# Patient Record
Sex: Male | Born: 1995
Health system: Southern US, Community
[De-identification: ages and names within clinical notes are randomized; demographics above are authoritative.]

## PROBLEM LIST (undated history)

## (undated) DIAGNOSIS — F329 Major depressive disorder, single episode, unspecified: Secondary | ICD-10-CM

## (undated) DIAGNOSIS — F419 Anxiety disorder, unspecified: Secondary | ICD-10-CM

## (undated) DIAGNOSIS — J309 Allergic rhinitis, unspecified: Secondary | ICD-10-CM

## (undated) DIAGNOSIS — L709 Acne, unspecified: Secondary | ICD-10-CM

## (undated) DIAGNOSIS — F32A Depression, unspecified: Secondary | ICD-10-CM

## (undated) DIAGNOSIS — Z973 Presence of spectacles and contact lenses: Secondary | ICD-10-CM

## (undated) HISTORY — DX: Acne, unspecified: L70.9

## (undated) HISTORY — DX: Anxiety disorder, unspecified: F41.9

## (undated) HISTORY — DX: Depression, unspecified: F32.A

## (undated) HISTORY — PX: LACERATION REPAIR: SHX5168

## (undated) HISTORY — DX: Major depressive disorder, single episode, unspecified: F32.9

## (undated) HISTORY — DX: Presence of spectacles and contact lenses: Z97.3

## (undated) HISTORY — PX: NASAL SEPTUM SURGERY: SHX37

## (undated) HISTORY — DX: Allergic rhinitis, unspecified: J30.9

---

## 2016-09-26 ENCOUNTER — Ambulatory Visit (INDEPENDENT_AMBULATORY_CARE_PROVIDER_SITE_OTHER): Payer: BC Managed Care – PPO | Admitting: Medical

## 2016-09-26 ENCOUNTER — Encounter: Payer: Self-pay | Admitting: Medical

## 2016-09-26 VITALS — BP 124/68 | HR 72 | Ht 74.0 in | Wt 211.8 lb

## 2016-09-26 DIAGNOSIS — F329 Major depressive disorder, single episode, unspecified: Secondary | ICD-10-CM | POA: Diagnosis not present

## 2016-09-26 DIAGNOSIS — F32A Depression, unspecified: Secondary | ICD-10-CM | POA: Insufficient documentation

## 2016-09-26 DIAGNOSIS — F419 Anxiety disorder, unspecified: Secondary | ICD-10-CM | POA: Diagnosis not present

## 2016-09-26 DIAGNOSIS — L309 Dermatitis, unspecified: Secondary | ICD-10-CM | POA: Diagnosis not present

## 2016-09-26 DIAGNOSIS — Z0000 Encounter for general adult medical examination without abnormal findings: Secondary | ICD-10-CM | POA: Diagnosis not present

## 2016-09-26 DIAGNOSIS — Z113 Encounter for screening for infections with a predominantly sexual mode of transmission: Secondary | ICD-10-CM | POA: Diagnosis not present

## 2016-09-26 DIAGNOSIS — R5383 Other fatigue: Secondary | ICD-10-CM

## 2016-09-26 DIAGNOSIS — Z8249 Family history of ischemic heart disease and other diseases of the circulatory system: Secondary | ICD-10-CM

## 2016-09-26 DIAGNOSIS — L709 Acne, unspecified: Secondary | ICD-10-CM

## 2016-09-26 NOTE — Progress Notes (Signed)
Subjective:   HPI  Darryl Harmon is a 21 y.o. male who presents for new patient physical.  [place] school at [name]. Chief Complaint  Patient presents with  . New Patient (Initial Visit)    cpe , want to tested for thyroid issues, tried , some hair loss a few months ago.    Medical care team includes: Tysinger, Kermit Baloavid S, PA-C here for primary care Dentist Eye doctor  Concerns: He notes fatigue feeling for few months.   snores some, no hx/o apnea.   Also gets skin flaking form ears regularly, lots of hair/oil production  Baths every other day.   Has bad back acne, has been on medication prior for same.  Uses exfoliating body wash and tea tree oil.  Is non fasting, ate 2 small tacos  Recently was starting on Prozac and propranolol, was given the script today.  Saw psychiatry this morning, [name]   Reviewed their medical, surgical, family, social, medication, and allergy history and updated chart as appropriate.  Past Medical History:  Diagnosis Date  . Acne   . Allergic rhinitis   . Anxiety   . Depression   . Wears glasses     Past Surgical History:  Procedure Laterality Date  . LACERATION REPAIR     right knee  . NASAL SEPTUM SURGERY      Social History   Social History  . Marital status: Single    Spouse name: N/A  . Number of children: N/A  . Years of education: N/A   Occupational History  . Not on file.   Social History Main Topics  . Smoking status: Former Smoker    Types: Cigarettes    Quit date: 04/2013  . Smokeless tobacco: Never Used  . Alcohol use 3.0 oz/week    1 Shots of liquor, 4 Glasses of wine per week     Comment: 1-2 times a week  . Drug use: No  . Sexual activity: Not on file   Other Topics Concern  . Not on file   Social History Narrative   Lives with roommate, exercise 5 days per week, video exercise, calisthenics.   Works at [employer], attends [employer], media studies, photography minor.    09/2016     Family History  Problem Relation Age of Onset  . Cancer Mother        breast  . Heart disease Mother        related to damage from chemotherapy  . Sleep apnea Mother   . Other Mother        pseudoseizure  . Depression Mother   . Insomnia Father   . Restless legs syndrome Father   . ADD / ADHD Father   . Glaucoma Father   . Depression Brother   . Post-traumatic stress disorder Brother   . Anxiety disorder Brother        MRSA  . Other Brother   . Heart disease Maternal Grandmother        CABG     Current Outpatient Prescriptions:  .  FLUoxetine (PROZAC) 20 MG tablet, Take 20 mg by mouth daily., Disp: , Rfl:  .  propranolol (INDERAL) 10 MG tablet, Take 10 mg by mouth 3 (three) times daily., Disp: , Rfl:   Not on File   Review of Systems Constitutional: -fever, -chills, -sweats, -unexpected weight change, -decreased appetite, +fatigue Allergy: -sneezing, -itching, -congestion Dermatology: -changing moles, --rash, -lumps ENT: -runny nose, -ear pain, -  sore throat, -hoarseness, -sinus pain, -teeth pain, - ringing in ears, -hearing loss, -nosebleeds Cardiology: -chest pain, -palpitations, -swelling, -difficulty breathing when lying flat, -waking up short of breath Respiratory: -cough, -shortness of breath, -difficulty breathing with exercise or exertion, -wheezing, -coughing up blood Gastroenterology: -abdominal pain, -nausea, -vomiting, -diarrhea, -constipation, -blood in stool, -changes in bowel movement, -difficulty swallowing or eating Hematology: -bleeding, -bruising  Musculoskeletal: -joint aches, -muscle aches, -joint swelling, -back pain, -neck pain, -cramping, -changes in gait Ophthalmology: denies vision changes, eye redness, itching, discharge Urology: -burning with urination, -difficulty urinating, -blood in urine, -urinary frequency, -urgency, -incontinence Neurology: -headache, -weakness, -tingling, -numbness, -memory loss, -falls, -dizziness Psychology:  -depressed mood, -agitation, -sleep problems     Objective:   BP 124/68   Pulse 72   Ht 6\' 2"  (1.88 m)   Wt 211 lb 12.8 oz (96.1 kg)   SpO2 96%   BMI 27.19 kg/m   General appearance: alert, no distress, WD/WN, Caucasian male Skin: moderate comedones through back, few other scattered macules, no worrisome lesions HEENT: normocephalic, conjunctiva/corneas normal, sclerae anicteric, PERRLA, EOMi, nares patent, no discharge or erythema, pharynx normal Oral cavity: MMM, tongue normal, teeth normal Neck: supple, no lymphadenopathy, no thyromegaly, no masses, normal ROM, no bruits Chest: non tender, normal shape and expansion Heart: RRR, normal S1, S2, no murmurs Lungs: CTA bilaterally, no wheezes, rhonchi, or rales Abdomen: +bs, soft, non tender, non distended, no masses, no hepatomegaly, no splenomegaly, no bruits Back: non tender, normal ROM, no scoliosis Musculoskeletal: right knee with long linear trauma scar from posterior inferior knee across to medial superior knee, otherwise upper extremities non tender, no obvious deformity, normal ROM throughout, lower extremities non tender, no obvious deformity, normal ROM throughout Extremities: no edema, no cyanosis, no clubbing Pulses: 2+ symmetric, upper and lower extremities, normal cap refill Neurological: alert, oriented x 3, CN2-12 intact, strength normal upper extremities and lower extremities, sensation normal throughout, DTRs 2+ throughout, no cerebellar signs, gait normal Psychiatric: normal affect, behavior normal, pleasant  GU: normal male external genitalia,circumcised, nontender, no masses, no hernia, no lymphadenopathy Rectal:deferred   Assessment and Plan :    Encounter Diagnoses  Name Primary?  . Encounter for health maintenance examination in adult Yes  . Screen for STD (sexually transmitted disease)   . Other fatigue   . Acne, unspecified acne type   . Dermatitis   . Family history of heart disease   . Anxiety and  depression     Physical exam - discussed and counseled on healthy lifestyle, diet, exercise, preventative care, vaccinations, sick and well care, proper use of emergency dept and after hours care, and addressed their concerns.    Health screening: See your eye doctor yearly for routine vision care. See your dentist yearly for routine dental care including hygiene visits twice yearly.  Discussed STD testing, discussed prevention, condom use, means of transmission  Cancer screening Discussed monthly self testicular exam  Vaccinations: Counseled on the following vaccines:  Influenza Td will be due 2019  Separate significant chronic issues discussed: fatigue - discussed differential, f/u pending labs Acne - he will get me name of the daily antibiotic he was on prior as it worked well  Atharva was seen today for new patient (initial visit).  Diagnoses and all orders for this visit:  Encounter for health maintenance examination in adult -     Comprehensive metabolic panel -     CBC -     Lipid panel -     TSH -  T4, free -     HIV antibody -     RPR -     GC/Chlamydia Probe Amp  Screen for STD (sexually transmitted disease) -     HIV antibody -     RPR -     GC/Chlamydia Probe Amp  Other fatigue  Acne, unspecified acne type  Dermatitis  Family history of heart disease  Anxiety and depression    Follow-up pending labs, yearly for physical

## 2016-09-27 LAB — CBC
HCT: 43.8 % (ref 38.5–50.0)
Hemoglobin: 15.3 g/dL (ref 13.2–17.1)
MCH: 31 pg (ref 27.0–33.0)
MCHC: 34.9 g/dL (ref 32.0–36.0)
MCV: 88.8 fL (ref 80.0–100.0)
MPV: 9 fL (ref 7.5–12.5)
PLATELETS: 257 10*3/uL (ref 140–400)
RBC: 4.93 MIL/uL (ref 4.20–5.80)
RDW: 12.9 % (ref 11.0–15.0)
WBC: 6.3 10*3/uL (ref 4.0–10.5)

## 2016-09-27 LAB — COMPREHENSIVE METABOLIC PANEL
ALBUMIN: 4.9 g/dL (ref 3.6–5.1)
ALT: 10 U/L (ref 9–46)
AST: 14 U/L (ref 10–40)
Alkaline Phosphatase: 65 U/L (ref 40–115)
BILIRUBIN TOTAL: 0.7 mg/dL (ref 0.2–1.2)
BUN: 17 mg/dL (ref 7–25)
CALCIUM: 9.7 mg/dL (ref 8.6–10.3)
CHLORIDE: 103 mmol/L (ref 98–110)
CO2: 23 mmol/L (ref 20–31)
Creat: 0.72 mg/dL (ref 0.60–1.35)
Glucose, Bld: 93 mg/dL (ref 65–99)
Potassium: 3.9 mmol/L (ref 3.5–5.3)
Sodium: 141 mmol/L (ref 135–146)
Total Protein: 7.3 g/dL (ref 6.1–8.1)

## 2016-09-27 LAB — LIPID PANEL
CHOL/HDL RATIO: 4 ratio (ref <5.0)
CHOLESTEROL: 133 mg/dL (ref <200)
HDL: 33 mg/dL — AB (ref >40)
LDL Cholesterol: 80 mg/dL (ref <100)
Triglycerides: 101 mg/dL (ref <150)
VLDL: 20 mg/dL (ref <30)

## 2016-09-27 LAB — TSH: TSH: 1.31 m[IU]/L (ref 0.40–4.50)

## 2016-09-27 LAB — GC/CHLAMYDIA PROBE AMP
CT PROBE, AMP APTIMA: NOT DETECTED
GC Probe RNA: NOT DETECTED

## 2016-09-27 LAB — T4, FREE: Free T4: 1.6 ng/dL (ref 0.8–1.8)

## 2016-09-27 LAB — RPR

## 2016-09-27 LAB — HIV ANTIBODY (ROUTINE TESTING W REFLEX): HIV 1&2 Ab, 4th Generation: NONREACTIVE

## 2016-10-19 ENCOUNTER — Telehealth: Payer: Self-pay | Admitting: Medical

## 2016-10-19 NOTE — Telephone Encounter (Signed)
Recv'd refill request from [facility] for Ondansetron ODT 4mg  tablets #20

## 2016-10-19 NOTE — Telephone Encounter (Signed)
Please inquire to see why he wants this?  I haven't prescribes this prior for him  If chronic nausea, then recheck on this

## 2016-10-21 ENCOUNTER — Other Ambulatory Visit: Payer: Self-pay | Admitting: Medical

## 2016-10-21 MED ORDER — ONDANSETRON HCL 4 MG PO TABS: 4.0000 mg | ORAL_TABLET | Freq: Every day | ORAL | 1 refills | Status: DC | PRN

## 2016-10-21 NOTE — Telephone Encounter (Signed)
Pt is using for nausea  He said sometime his has nausea, and but is not having nausea now.

## 2016-10-24 ENCOUNTER — Telehealth: Payer: Self-pay | Admitting: Medical

## 2016-10-24 NOTE — Telephone Encounter (Signed)
Rcvd refill request for Ondansetron 4 mg #20

## 2017-07-28 ENCOUNTER — Telehealth: Payer: Self-pay

## 2017-07-28 ENCOUNTER — Other Ambulatory Visit: Payer: Self-pay | Admitting: Medical

## 2017-07-28 ENCOUNTER — Encounter: Payer: Self-pay | Admitting: Medical

## 2017-07-28 ENCOUNTER — Ambulatory Visit: Payer: BC Managed Care – PPO | Admitting: Medical

## 2017-07-28 VITALS — BP 120/62 | HR 91 | Temp 98.0°F | Ht 73.25 in | Wt 184.6 lb

## 2017-07-28 DIAGNOSIS — R55 Syncope and collapse: Secondary | ICD-10-CM | POA: Diagnosis not present

## 2017-07-28 DIAGNOSIS — R110 Nausea: Secondary | ICD-10-CM

## 2017-07-28 DIAGNOSIS — R12 Heartburn: Secondary | ICD-10-CM | POA: Diagnosis not present

## 2017-07-28 MED ORDER — OMEPRAZOLE 40 MG PO CPDR: 40.0000 mg | DELAYED_RELEASE_CAPSULE | Freq: Every day | ORAL | 0 refills | Status: DC

## 2017-07-28 NOTE — Telephone Encounter (Signed)
Patient want his lab results faxed to [facility]

## 2017-07-28 NOTE — Telephone Encounter (Signed)
Pt wanted to clarify that the labs should go to [facility] at [facility]

## 2017-07-28 NOTE — Progress Notes (Signed)
Subjective: Chief Complaint  Patient presents with  . Heartburn    fainting, SOB when lying down and nausea x   Here for acid reflux problems lately.  Here alone.  Has had fainting spells a few times lately.  These were reportedly witnessed.   One time fainted backwards onto the bed.   Another time was in and out of consciousness hitting his head on the floor, trying to catch himself per one friend.  This happened 2-3 weeks ago.  Thinks he has fainted 2-3 times recently.  With the faint spells, feels dizzy prior the spell, feels nauseated, then things fade to black, and he awakes up on the floor.  LOC is reportedly seconds to a minute.     When lying down gets chest pain like something on his chest, makes it difficult to breath, but not like difficulty breathing with anxiety.  He thinks he was trembly the first faint spell, wondered about seizure, but others said he seemed still.  No swelling in legs.   No palpitations of the heart.    He reports the acid reflux is like entire chest is on fire ,begin squeezed, and this awakes him from sleep.   Been having problems with this a lot in recent weeks, but has had problems with acid reflux most of his life.   Drinks alcohol seldomly.   Smokes.  Smokes marijuana.   Has used acid and mushrooms at times.     Water intake is good, diet could be better.  Sleep is ok.   Not a lot of caffeine.  Past Medical History:  Diagnosis Date  . Acne   . Allergic rhinitis   . Anxiety   . Depression   . Wears glasses    Current Outpatient Medications on File Prior to Visit  Medication Sig Dispense Refill  . ARIPiprazole (ABILIFY) 2 MG tablet TK 1 PO QD  1  . sertraline (ZOLOFT) 100 MG tablet TK 1 PO QD  1   No current facility-administered medications on file prior to visit.    Family History  Problem Relation Age of Onset  . Cancer Mother        breast  . Heart disease Mother        related to damage from chemotherapy  . Sleep apnea Mother   .  Other Mother        pseudoseizure  . Depression Mother   . Insomnia Father   . Restless legs syndrome Father   . ADD / ADHD Father   . Glaucoma Father   . Depression Brother   . Post-traumatic stress disorder Brother   . Anxiety disorder Brother        MRSA  . Other Brother   . Heart disease Maternal Grandmother        CABG     ROS as in subjective   Objective: BP 120/62   Pulse 91   Temp 98 F (36.7 C) (Oral)   Ht 6' 1.25" (1.861 m)   Wt 184 lb 9.6 oz (83.7 kg)   SpO2 99%   BMI 24.19 kg/m     General appearance: alert, no distress, WD/WN, white male, blue hair coloring HEENT: normocephalic, sclerae anicteric, PERRLA, EOMi, nares patent, no discharge or erythema, pharynx normal Oral cavity: MMM, no lesions Neck: supple, no lymphadenopathy, no thyromegaly, no masses, no bruits Heart: RRR, normal S1, S2, no murmurs Lungs: CTA bilaterally, no wheezes, rhonchi, or rales Abdomen: +bs, soft, non tender, non distended,  no masses, no hepatomegaly, no splenomegaly Extremities: no edema, no cyanosis, no clubbing Pulses: 2+ symmetric, upper and lower extremities, normal cap refill Neurological: alert, oriented x 3, CN2-12 intact, strength normal upper extremities and lower extremities, sensation normal throughout, DTRs 2+ throughout, no cerebellar signs, gait normal Psychiatric: normal affect, behavior normal, pleasant    Adult ECG Report  Indication: syncope  Rate: 70ms  Rhythm: normal sinus rhythm and sinus arrhythmia  QRS Axis: 55 degrees  PR Interval:  QRS Duration: 86ms  QTc:  Conduction Disturbances: none  Other Abnormalities: none  Patient's cardiac risk factors are: none.  EKG comparison: none  Narrative Interpretation: sinus arrhythmia     Assessment: Encounter Diagnoses  Name Primary?  . Syncope, unspecified syncope type Yes  . Heartburn   . Nausea     Plan: Discussed symptoms, concerns.   EKG reviewed, orthostatics reviewed, labs  today.  Advised good hydration, discussed diet, adequate nutrients, healthy food choices.  adversity he not skip meals  Advised 7-8 hours of sleep nightly.   Counseled against substance abuse, drug use, and discussed possible consequences.     GERD - begin trial of omeprazole, avoid GERD triggers.  Discussed symptoms or signs that would prompt call to 911, such as severe chest pain, seizure activity.    Psalm was seen today for heartburn.  Diagnoses and all orders for this visit:  Syncope, unspecified syncope type -     EKG 12-Lead -     Orthostatic vital signs -     TSH -     Basic metabolic panel -     CBC -     Drug Screen, Urine  Heartburn -     EKG 12-Lead -     Orthostatic vital signs  Nausea -     EKG 12-Lead -     Orthostatic vital signs  Other orders -     omeprazole (PRILOSEC) 40 MG capsule; Take 1 capsule (40 mg total) by mouth daily.

## 2017-07-29 LAB — BASIC METABOLIC PANEL
BUN / CREAT RATIO: 13 (ref 9–20)
BUN: 10 mg/dL (ref 6–20)
CALCIUM: 10 mg/dL (ref 8.7–10.2)
CHLORIDE: 102 mmol/L (ref 96–106)
CO2: 25 mmol/L (ref 20–29)
CREATININE: 0.78 mg/dL (ref 0.76–1.27)
GFR calc Af Amer: 148 mL/min/{1.73_m2} (ref >59)
GFR calc non Af Amer: 128 mL/min/{1.73_m2} (ref >59)
GLUCOSE: 85 mg/dL (ref 65–99)
Potassium: 4.8 mmol/L (ref 3.5–5.2)
Sodium: 145 mmol/L — ABNORMAL HIGH (ref 134–144)

## 2017-07-29 LAB — CBC
HEMATOCRIT: 44.2 % (ref 37.5–51.0)
Hemoglobin: 15.1 g/dL (ref 13.0–17.7)
MCH: 31.2 pg (ref 26.6–33.0)
MCHC: 34.2 g/dL (ref 31.5–35.7)
MCV: 91 fL (ref 79–97)
PLATELETS: 244 10*3/uL (ref 150–379)
RBC: 4.84 x10E6/uL (ref 4.14–5.80)
RDW: 12.7 % (ref 12.3–15.4)
WBC: 5.7 10*3/uL (ref 3.4–10.8)

## 2017-07-29 LAB — DRUG SCREEN, URINE
Amphetamines, Urine: NEGATIVE ng/mL
BARBITURATE SCREEN URINE: NEGATIVE ng/mL
BENZODIAZEPINE QUANT UR: NEGATIVE ng/mL
CANNABINOID QUANT UR: POSITIVE ng/mL — AB
Cocaine (Metab.): NEGATIVE ng/mL
OPIATE QUANT UR: NEGATIVE ng/mL
PCP Quant, Ur: NEGATIVE ng/mL

## 2017-07-29 LAB — TSH: TSH: 0.857 u[IU]/mL (ref 0.450–4.500)

## 2017-09-11 ENCOUNTER — Other Ambulatory Visit: Payer: Self-pay | Admitting: Medical

## 2017-10-03 ENCOUNTER — Emergency Department (HOSPITAL_COMMUNITY)
Admission: EM | Admit: 2017-10-03 | Discharge: 2017-10-03 | Disposition: A | Discharge status: Home / Self Care | Payer: BLUE CROSS/BLUE SHIELD | Attending: Emergency Medicine | Admitting: Emergency Medicine

## 2017-10-03 ENCOUNTER — Other Ambulatory Visit: Payer: Self-pay

## 2017-10-03 DIAGNOSIS — F17210 Nicotine dependence, cigarettes, uncomplicated: Secondary | ICD-10-CM | POA: Diagnosis not present

## 2017-10-03 DIAGNOSIS — R531 Weakness: Secondary | ICD-10-CM

## 2017-10-03 DIAGNOSIS — Z79899 Other long term (current) drug therapy: Secondary | ICD-10-CM | POA: Insufficient documentation

## 2017-10-03 DIAGNOSIS — R110 Nausea: Secondary | ICD-10-CM | POA: Diagnosis not present

## 2017-10-03 DIAGNOSIS — R079 Chest pain, unspecified: Secondary | ICD-10-CM | POA: Insufficient documentation

## 2017-10-03 DIAGNOSIS — R001 Bradycardia, unspecified: Secondary | ICD-10-CM | POA: Insufficient documentation

## 2017-10-03 DIAGNOSIS — R42 Dizziness and giddiness: Secondary | ICD-10-CM | POA: Diagnosis not present

## 2017-10-03 LAB — URINALYSIS, ROUTINE W REFLEX MICROSCOPIC
Bilirubin Urine: NEGATIVE
Glucose, UA: NEGATIVE mg/dL
Hgb urine dipstick: NEGATIVE
Ketones, ur: NEGATIVE mg/dL
Leukocytes, UA: NEGATIVE
Nitrite: NEGATIVE
PROTEIN: NEGATIVE mg/dL
Specific Gravity, Urine: 1.023 (ref 1.005–1.030)
pH: 6 (ref 5.0–8.0)

## 2017-10-03 LAB — CBC
HCT: 41.9 % (ref 39.0–52.0)
HEMOGLOBIN: 14 g/dL (ref 13.0–17.0)
MCH: 31 pg (ref 26.0–34.0)
MCHC: 33.4 g/dL (ref 30.0–36.0)
MCV: 92.7 fL (ref 78.0–100.0)
PLATELETS: 250 10*3/uL (ref 150–400)
RBC: 4.52 MIL/uL (ref 4.22–5.81)
RDW: 12 % (ref 11.5–15.5)
WBC: 6.5 10*3/uL (ref 4.0–10.5)

## 2017-10-03 LAB — BASIC METABOLIC PANEL
ANION GAP: 8 (ref 5–15)
BUN: 15 mg/dL (ref 6–20)
CALCIUM: 9.6 mg/dL (ref 8.9–10.3)
CHLORIDE: 106 mmol/L (ref 98–111)
CO2: 29 mmol/L (ref 22–32)
CREATININE: 0.86 mg/dL (ref 0.61–1.24)
GFR calc non Af Amer: >60 mL/min (ref >60)
Glucose, Bld: 95 mg/dL (ref 70–99)
Potassium: 3.8 mmol/L (ref 3.5–5.1)
Sodium: 143 mmol/L (ref 135–145)

## 2017-10-03 MED ORDER — ONDANSETRON HCL 4 MG PO TABS
4.0000 mg | ORAL_TABLET | Freq: Once | ORAL | Status: AC
  Administered 2017-10-03: 4 mg via ORAL
  Filled 2017-10-03: qty 1

## 2017-10-03 NOTE — ED Provider Notes (Signed)
[facility] EMERGENCY DEPARTMENT Provider Note   CSN: [id number] Arrival date & time: 10/03/17  0216     History   Chief Complaint Chief Complaint  Patient presents with  . Bradycardia  . Weakness  . Dizziness    HPI Darryl Harmon is a 22 y.o. male.  22 y.o male with no PMH presents to the ED complaining of bradycardia and weakness x 1 month. Patient states he had an episode of fainting a month ago and this is when the symptoms began. He reports weakness, slow heart rate, dizziness and nausea. Patient states he has seen his HR go from 60-80 on his apple watch and reports concern.He was lying down when HR was in the ~60 range. He was seen recently for this complaint by his PCP and was told his diet needed to improve. Patient reports modifying his diet eating chicken, fried rice and stir fry lately. He is also complaining of chest tightness, but no radiation to extremities or back. Patient's mother has a history of ACS and he is concerned.He denies any vomiting, diarrhea, shortness of breath or recent illness.      Past Medical History:  Diagnosis Date  . Acne   . Allergic rhinitis   . Anxiety   . Depression   . Wears glasses     Patient Active Problem List   Diagnosis Date Noted  . Encounter for health maintenance examination in adult 09/26/2016  . Screen for STD (sexually transmitted disease) 09/26/2016  . Other fatigue 09/26/2016  . Acne 09/26/2016  . Dermatitis 09/26/2016  . Family history of heart disease 09/26/2016  . Anxiety and depression 09/26/2016    The histories are not reviewed yet. Please review them in the "History" navigator section and refresh this SmartLink.      Home Medications    Prior to Admission medications   Medication Sig Start Date End Date Taking? Authorizing Provider  ARIPiprazole (ABILIFY) 2 MG tablet TAKE 2 MG BY MOUTH DAILY 07/26/17  Yes [provider]  omeprazole (PRILOSEC) 40 MG capsule TAKE 1 CAPSULE(40  MG) BY MOUTH DAILY 09/12/17  Yes Tysinger, Kermit Balo, PA-C  sertraline (ZOLOFT) 100 MG tablet TAKE 100 MG BY MOUTH DAILY 07/26/17  Yes [provider]  traZODone (DESYREL) 100 MG tablet Take 100 mg by mouth at bedtime.   Yes [provider]    Family History Family History  Problem Relation Age of Onset  . Cancer Mother        breast  . Heart disease Mother        related to damage from chemotherapy  . Sleep apnea Mother   . Other Mother        pseudoseizure  . Depression Mother   . Insomnia Father   . Restless legs syndrome Father   . ADD / ADHD Father   . Glaucoma Father   . Depression Brother   . Post-traumatic stress disorder Brother   . Anxiety disorder Brother        MRSA  . Other Brother   . Heart disease Maternal Grandmother        CABG    Social History Social History   Tobacco Use  . Smoking status: Current Every Day Smoker    Types: Cigarettes  . Smokeless tobacco: Never Used  Substance Use Topics  . Alcohol use: Yes    Alcohol/week: 3.0 oz    Types: 4 Glasses of wine, 1 Shots of liquor per week  Comment: 1-2 times a week  . Drug use: No     Allergies   Patient has no known allergies.   Review of Systems Review of Systems  Constitutional: Negative for chills and fever.  HENT: Negative for ear pain, facial swelling, rhinorrhea and sore throat.   Eyes: Negative for pain and visual disturbance.  Respiratory: Negative for cough and shortness of breath.   Cardiovascular: Negative for chest pain and palpitations.  Gastrointestinal: Positive for nausea. Negative for abdominal pain, diarrhea and vomiting.  Genitourinary: Negative for dysuria and hematuria.  Musculoskeletal: Negative for arthralgias and back pain.  Skin: Negative for color change and rash.  Neurological: Positive for weakness. Negative for seizures and syncope.  All other systems reviewed and are negative.    Physical Exam Updated Vital Signs BP 120/81 (BP Location:  Left Arm)   Pulse 67   Temp 98.5 F (36.9 C) (Oral)   Resp 18   Ht 6\' 2"  (1.88 m)   Wt 83.9 kg (185 lb)   SpO2 98%   BMI 23.75 kg/m   Physical Exam  Constitutional: He is oriented to person, place, and time. He appears well-developed and well-nourished.  HENT:  Head: Normocephalic and atraumatic.  Eyes: Pupils are equal, round, and reactive to light.  Neck: Normal range of motion.  Cardiovascular: Normal heart sounds.  Pulmonary/Chest: Effort normal and breath sounds normal. He has no wheezes.  Abdominal: Soft. Normal appearance and bowel sounds are normal. There is no tenderness. There is no guarding, no tenderness at McBurney's point and negative Murphy's sign.  Musculoskeletal: He exhibits no tenderness or deformity.  Neurological: He is alert and oriented to person, place, and time.  Skin: Skin is warm and dry.  Nursing note and vitals reviewed.    ED Treatments / Results  Labs (all labs ordered are listed, but only abnormal results are displayed) Labs Reviewed  BASIC METABOLIC PANEL  CBC  URINALYSIS, ROUTINE W REFLEX MICROSCOPIC  CBG MONITORING, ED    EKG EKG Interpretation  Date/Time:  Tuesday October 03 2017 02:37:26 EDT Ventricular Rate:  73 PR Interval:  142 QRS Duration: 90 QT Interval:  370 QTC Calculation: 407 R Axis:   82 Text Interpretation:  Normal sinus rhythm with sinus arrhythmia Normal ECG No old tracing to compare Confirmed by Dione Booze ([id number]) on 10/03/2017 4:41:44 AM Also confirmed by Dione Booze ([id number]), editor Elita Quick ([id number])  on 10/03/2017 6:50:33 AM   Radiology No results found.  Procedures Procedures (including critical care time)  Medications Ordered in ED Medications  ondansetron (ZOFRAN) tablet 4 mg (4 mg Oral Given 10/03/17 0727)     Initial Impression / Assessment and Plan / ED Course  I have reviewed the triage vital signs and the nursing notes.  Pertinent labs & imaging results that were available during my  care of the patient were reviewed by me and considered in my medical decision making (see chart for details).     Patient CBC showed no leukocytosis,patient is currently asymptomatic.His BMP showed no electrolyte abnormality,glucose was 95. Patient reports low HR, EKG showed normal sinus rhythm with a HR 73. Urinalysis did not show any signs of infection and patient denies any urinary symptoms.Patient is currently a sleep in stretcher. I have advised patient to drink plenty of fluids, try to limit caffeine and follow up with PCP. Return precautions have been provided.   Final Clinical Impressions(s) / ED Diagnoses   Final diagnoses:  Generalized weakness  Nausea  ED Discharge Orders    None       Claude MangesSoto, Marilu Rylander, [place]-C 10/03/17 [id number]    Derwood KaplanNanavati, Ankit, MD 10/05/17 1009

## 2017-10-03 NOTE — ED Notes (Signed)
Pt discharged from ED; instructions provided; Pt encouraged to return to ED if symptoms worsen and to f/u with PCP; Pt verbalized understanding of all instructions 

## 2017-10-03 NOTE — Discharge Instructions (Addendum)
All laboratory results are within normal limites.Please continue to drink plenty of fluids. Try reducing caffeine as this can dehydrate you and make your HR elevated.Please follow up with PCP if symptoms worsen. Please return to the ED if you experience any

## 2017-10-03 NOTE — ED Triage Notes (Signed)
Pt also c/o headache and dizziness started approx 10 minutes ago

## 2017-10-03 NOTE — ED Triage Notes (Signed)
Pt states approx 20 minutes ago his heart rate went to 59 then shot up to 101 per apple watch. States he is having weakness all over and difficult to breath.

## 2018-04-09 ENCOUNTER — Ambulatory Visit (INDEPENDENT_AMBULATORY_CARE_PROVIDER_SITE_OTHER): Payer: BC Managed Care – PPO | Admitting: Orthopedic Surgery

## 2018-04-09 ENCOUNTER — Encounter (HOSPITAL_COMMUNITY): Payer: Self-pay | Admitting: Emergency Medicine

## 2018-04-09 ENCOUNTER — Encounter (INDEPENDENT_AMBULATORY_CARE_PROVIDER_SITE_OTHER): Payer: Self-pay | Admitting: Orthopedic Surgery

## 2018-04-09 ENCOUNTER — Emergency Department (HOSPITAL_COMMUNITY): Payer: 59

## 2018-04-09 ENCOUNTER — Emergency Department (HOSPITAL_COMMUNITY)
Admission: EM | Admit: 2018-04-09 | Discharge: 2018-04-09 | Disposition: A | Discharge status: Home / Self Care | Payer: 59 | Attending: Emergency Medicine | Admitting: Emergency Medicine

## 2018-04-09 ENCOUNTER — Other Ambulatory Visit: Payer: Self-pay

## 2018-04-09 DIAGNOSIS — Y929 Unspecified place or not applicable: Secondary | ICD-10-CM | POA: Diagnosis not present

## 2018-04-09 DIAGNOSIS — S8992XA Unspecified injury of left lower leg, initial encounter: Secondary | ICD-10-CM | POA: Diagnosis present

## 2018-04-09 DIAGNOSIS — F17210 Nicotine dependence, cigarettes, uncomplicated: Secondary | ICD-10-CM | POA: Insufficient documentation

## 2018-04-09 DIAGNOSIS — W010XXA Fall on same level from slipping, tripping and stumbling without subsequent striking against object, initial encounter: Secondary | ICD-10-CM | POA: Diagnosis not present

## 2018-04-09 DIAGNOSIS — Y939 Activity, unspecified: Secondary | ICD-10-CM | POA: Insufficient documentation

## 2018-04-09 DIAGNOSIS — Y999 Unspecified external cause status: Secondary | ICD-10-CM | POA: Insufficient documentation

## 2018-04-09 DIAGNOSIS — W19XXXA Unspecified fall, initial encounter: Secondary | ICD-10-CM

## 2018-04-09 DIAGNOSIS — Z79899 Other long term (current) drug therapy: Secondary | ICD-10-CM | POA: Diagnosis not present

## 2018-04-09 DIAGNOSIS — M25562 Pain in left knee: Secondary | ICD-10-CM

## 2018-04-09 MED ORDER — IBUPROFEN 800 MG PO TABS: 800.0000 mg | ORAL_TABLET | Freq: Three times a day (TID) | ORAL | 0 refills | Status: DC | PRN

## 2018-04-09 MED ORDER — TRAMADOL HCL 50 MG PO TABS: 50.0000 mg | ORAL_TABLET | Freq: Four times a day (QID) | ORAL | 0 refills | Status: DC | PRN

## 2018-04-09 NOTE — ED Notes (Signed)
Patient verbalizes understanding of discharge instructions. Opportunity for questioning and answers were provided. Armband removed by staff, pt discharged from ED ambulatory with crutches. ° °

## 2018-04-09 NOTE — Discharge Instructions (Addendum)
Return here as needed. You will need to follow up with the orthopedist provided.  Your x-rays are normal but there is potential that there is still injury that can't be seen on plain x-rays. Ice and elevate the knee.

## 2018-04-09 NOTE — ED Triage Notes (Signed)
Pt reports having slipped on a wet surface last night and his L knee bent medially. Denies obvious fx or dislocation. Able to stand but with marked pain. Took 800mg  ibuprofen at 0300. Pain decr'd at rest but worse with movement.

## 2018-04-10 NOTE — ED Provider Notes (Signed)
[facility] EMERGENCY DEPARTMENT Provider Note   CSN: [id number] Arrival date & time: 04/09/18  0645     History   Chief Complaint Chief Complaint  Patient presents with  . Knee Pain    HPI Darryl Harmon is a 23 y.o. male.  HPI Patient presents to the emergency department with knee injury that occurred yesterday following a fall.  Patient states he fell twisting his left knee.  The patient states that he had pain and difficulty ambulating since that time.  The patient states nothing seems make the condition better but movements and palpation make the pain worse.  Patient states that he did not take any medications other than ibuprofen prior to arrival.  Patient denies any other injuries.  Patient denies any back or neck pain. Past Medical History:  Diagnosis Date  . Acne   . Allergic rhinitis   . Anxiety   . Depression   . Wears glasses     Patient Active Problem List   Diagnosis Date Noted  . Encounter for health maintenance examination in adult 09/26/2016  . Screen for STD (sexually transmitted disease) 09/26/2016  . Other fatigue 09/26/2016  . Acne 09/26/2016  . Dermatitis 09/26/2016  . Family history of heart disease 09/26/2016  . Anxiety and depression 09/26/2016    Past Surgical History:  Procedure Laterality Date  . LACERATION REPAIR     right knee  . NASAL SEPTUM SURGERY          Home Medications    Prior to Admission medications   Medication Sig Start Date End Date Taking? Authorizing Provider  ARIPiprazole (ABILIFY) 2 MG tablet TAKE 2 MG BY MOUTH DAILY 07/26/17   [provider]  ibuprofen (ADVIL,MOTRIN) 800 MG tablet Take 1 tablet (800 mg total) by mouth every 8 (eight) hours as needed. 04/09/18   Azalie Harbeck, Cristal Deer, PA-C  omeprazole (PRILOSEC) 40 MG capsule TAKE 1 CAPSULE(40 MG) BY MOUTH DAILY 09/12/17   Tysinger, Kermit Balo, PA-C  sertraline (ZOLOFT) 100 MG tablet TAKE 100 MG BY MOUTH DAILY 07/26/17   [provider]    traMADol (ULTRAM) 50 MG tablet Take 1 tablet (50 mg total) by mouth every 6 (six) hours as needed for severe pain. 04/09/18   Ryli Standlee, Cristal Deer, PA-C  traZODone (DESYREL) 100 MG tablet Take 100 mg by mouth at bedtime.    [provider]    Family History Family History  Problem Relation Age of Onset  . Cancer Mother        breast  . Heart disease Mother        related to damage from chemotherapy  . Sleep apnea Mother   . Other Mother        pseudoseizure  . Depression Mother   . Insomnia Father   . Restless legs syndrome Father   . ADD / ADHD Father   . Glaucoma Father   . Depression Brother   . Post-traumatic stress disorder Brother   . Anxiety disorder Brother        MRSA  . Other Brother   . Heart disease Maternal Grandmother        CABG    Social History Social History   Tobacco Use  . Smoking status: Current Every Day Smoker    Types: Cigarettes  . Smokeless tobacco: Never Used  Substance Use Topics  . Alcohol use: Yes    Alcohol/week: 5.0 standard drinks    Types: 4 Glasses of wine, 1 Shots of liquor  per week    Comment: 1-2 times a week  . Drug use: No     Allergies   Patient has no known allergies.   Review of Systems Review of Systems  All other systems negative except as documented in the HPI. All pertinent positives and negatives as reviewed in the HPI. Physical Exam Updated Vital Signs BP 124/81 (BP Location: Left Arm)   Pulse 76   Temp 98.4 F (36.9 C) (Oral)   Resp 16   Ht 6\' 2"  (1.88 m)   Wt 106.6 kg   SpO2 99%   BMI 30.17 kg/m   Physical Exam Vitals signs and nursing note reviewed.  Constitutional:      General: He is not in acute distress.    Appearance: He is well-developed.  HENT:     Head: Normocephalic and atraumatic.  Eyes:     Pupils: Pupils are equal, round, and reactive to light.  Pulmonary:     Effort: Pulmonary effort is normal.  Musculoskeletal:     Left knee: He exhibits decreased range of motion. He  exhibits no swelling, no effusion, no ecchymosis, no deformity, no erythema, normal alignment and no bony tenderness. Tenderness found. Medial joint line tenderness noted.  Skin:    General: Skin is warm and dry.  Neurological:     Mental Status: He is alert and oriented to person, place, and time.      ED Treatments / Results  Labs (all labs ordered are listed, but only abnormal results are displayed) Labs Reviewed - No data to display  EKG None  Radiology Dg Knee Complete 4 Views Left  Result Date: 04/09/2018 CLINICAL DATA:  Twisting left knee injury, pain, difficulty bearing weight EXAM: LEFT KNEE - COMPLETE 4+ VIEW COMPARISON:  None. FINDINGS: No evidence of fracture, dislocation, or joint effusion. No evidence of arthropathy or other focal bone abnormality. Soft tissues are unremarkable. IMPRESSION: Negative. Electronically Signed   By: Judie PetitM.  Shick M.D.   On: 04/09/2018 07:25    Procedures Procedures (including critical care time)  Medications Ordered in ED Medications - No data to display   Initial Impression / Assessment and Plan / ED Course  I have reviewed the triage vital signs and the nursing notes.  Pertinent labs & imaging results that were available during my care of the patient were reviewed by me and considered in my medical decision making (see chart for details).    Should be placed in a knee immobilizer and given referral to orthopedic.  Patient is advised ice and elevate the knee.  Told to return here as needed.  Final Clinical Impressions(s) / ED Diagnoses   Final diagnoses:  Knee injury, left, initial encounter  Fall, initial encounter  Acute pain of left knee    ED Discharge Orders         Ordered    traMADol (ULTRAM) 50 MG tablet  Every 6 hours PRN,   Status:  Discontinued     04/09/18 0751    ibuprofen (ADVIL,MOTRIN) 800 MG tablet  Every 8 hours PRN,   Status:  Discontinued     04/09/18 0751    traMADol (ULTRAM) 50 MG tablet  Every 6 hours  PRN     04/09/18 0752    ibuprofen (ADVIL,MOTRIN) 800 MG tablet  Every 8 hours PRN     04/09/18 0752           Charlestine NightLawyer, Ziere Docken, PA-C 04/10/18 1526    Margarita Grizzleay, Danielle, MD 04/10/18 1555

## 2018-04-11 ENCOUNTER — Ambulatory Visit (INDEPENDENT_AMBULATORY_CARE_PROVIDER_SITE_OTHER): Payer: 59 | Admitting: Orthopedic Surgery

## 2018-04-11 DIAGNOSIS — W010XXA Fall on same level from slipping, tripping and stumbling without subsequent striking against object, initial encounter: Secondary | ICD-10-CM | POA: Diagnosis not present

## 2018-04-11 DIAGNOSIS — S8992XA Unspecified injury of left lower leg, initial encounter: Secondary | ICD-10-CM

## 2018-04-11 DIAGNOSIS — M25562 Pain in left knee: Secondary | ICD-10-CM | POA: Diagnosis not present

## 2018-04-16 ENCOUNTER — Encounter (INDEPENDENT_AMBULATORY_CARE_PROVIDER_SITE_OTHER): Payer: Self-pay | Admitting: Orthopedic Surgery

## 2018-04-16 NOTE — Progress Notes (Signed)
Office Visit Note   Patient: Darryl BrooksZachary Harmon           Date of Birth: 29-Jan-1996           MRN: [id number] Visit Date: 04/11/2018 Requested by: Jac Canavanysinger, David S, PA-C [address] [place], [place] [id number] PCP: Jac Canavanysinger, David S, PA-C  Subjective: Chief Complaint  Patient presents with  . Left Knee - Injury    HPI: Darryl CoderZachary is a patient with left knee pain.  Slipped on the water Sunday night and fell and his knee went inward.  [facility]  States that his kneecap "feels weird" when he is walking.  Taking tramadol for pain.  Cannot take anti-inflammatories.              ROS: All systems reviewed are negative as they relate to the chief complaint within the history of present illness.  Patient denies  fevers or chills.   Assessment & Plan: Visit Diagnoses:  1. Acute pain of left knee     Plan: Impression is left knee pain with possible MCL strain.  No effusion in the knee and I do not think that he has any internal derangement.  This put him in a hinged knee brace for 3 weeks and then come back and we will see how he is doing.  Could consider for or against further imaging at that time  Follow-Up Instructions: Return in about 3 weeks (around 05/02/2018).   Orders:  No orders of the defined types were placed in this encounter.  No orders of the defined types were placed in this encounter.     Procedures: No procedures performed   Clinical Data: No additional findings.  Objective: Vital Signs: There were no vitals taken for this visit.  Physical Exam:   Constitutional: Patient appears well-developed HEENT:  Head: Normocephalic Eyes:EOM are normal Neck: Normal range of motion Cardiovascular: Normal rate Pulmonary/chest: Effort normal Neurologic: Patient is alert Skin: Skin is warm Psychiatric: Patient has normal mood and affect    Ortho Exam: Ortho exam demonstrates medial sided tenderness left knee but no patellar apprehension.  No  effusion in the knee.  Collateral and cruciate ligaments are stable.  No other masses lymphadenopathy or skin changes noted in that left knee region.  Pedal pulses palpable.  Specialty Comments:  No specialty comments available.  Imaging: No results found.   PMFS History: Patient Active Problem List   Diagnosis Date Noted  . Encounter for health maintenance examination in adult 09/26/2016  . Screen for STD (sexually transmitted disease) 09/26/2016  . Other fatigue 09/26/2016  . Acne 09/26/2016  . Dermatitis 09/26/2016  . Family history of heart disease 09/26/2016  . Anxiety and depression 09/26/2016   Past Medical History:  Diagnosis Date  . Acne   . Allergic rhinitis   . Anxiety   . Depression   . Wears glasses     Family History  Problem Relation Age of Onset  . Cancer Mother        breast  . Heart disease Mother        related to damage from chemotherapy  . Sleep apnea Mother   . Other Mother        pseudoseizure  . Depression Mother   . Insomnia Father   . Restless legs syndrome Father   . ADD / ADHD Father   . Glaucoma Father   . Depression Brother   . Post-traumatic stress disorder Brother   .  Anxiety disorder Brother        MRSA  . Other Brother   . Heart disease Maternal Grandmother        CABG    Past Surgical History:  Procedure Laterality Date  . LACERATION REPAIR     right knee  . NASAL SEPTUM SURGERY     Social History   Occupational History  . Not on file  Tobacco Use  . Smoking status: Current Every Day Smoker    Types: Cigarettes  . Smokeless tobacco: Never Used  Substance and Sexual Activity  . Alcohol use: Yes    Alcohol/week: 5.0 standard drinks    Types: 4 Glasses of wine, 1 Shots of liquor per week    Comment: 1-2 times a week  . Drug use: No  . Sexual activity: Not on file

## 2018-04-17 ENCOUNTER — Encounter (HOSPITAL_COMMUNITY): Payer: Self-pay

## 2018-04-17 ENCOUNTER — Other Ambulatory Visit: Payer: Self-pay

## 2018-04-17 ENCOUNTER — Emergency Department (HOSPITAL_COMMUNITY)
Admission: EM | Admit: 2018-04-17 | Discharge: 2018-04-17 | Disposition: A | Discharge status: Home / Self Care | Payer: 59 | Attending: Emergency Medicine | Admitting: Emergency Medicine

## 2018-04-17 DIAGNOSIS — M25562 Pain in left knee: Secondary | ICD-10-CM | POA: Insufficient documentation

## 2018-04-17 DIAGNOSIS — Z5321 Procedure and treatment not carried out due to patient leaving prior to being seen by health care provider: Secondary | ICD-10-CM | POA: Insufficient documentation

## 2018-04-17 NOTE — ED Notes (Signed)
Pt states he is leaving and going to the urgent care in the morning. Left AMA

## 2018-04-17 NOTE — ED Triage Notes (Signed)
Pt here after being diagnosed with a acl sprain and had a slip since then and having more pain the left. Pt unable to bear any weight without pain.  A&ox4.

## 2018-04-18 ENCOUNTER — Encounter (INDEPENDENT_AMBULATORY_CARE_PROVIDER_SITE_OTHER): Payer: Self-pay | Admitting: Orthopedic Surgery

## 2018-04-18 ENCOUNTER — Ambulatory Visit (INDEPENDENT_AMBULATORY_CARE_PROVIDER_SITE_OTHER): Payer: 59 | Admitting: Orthopedic Surgery

## 2018-04-18 DIAGNOSIS — G8929 Other chronic pain: Secondary | ICD-10-CM

## 2018-04-18 DIAGNOSIS — M25562 Pain in left knee: Secondary | ICD-10-CM

## 2018-04-18 MED ORDER — TRAMADOL HCL 50 MG PO TABS: 50.0000 mg | ORAL_TABLET | Freq: Two times a day (BID) | ORAL | 0 refills | Status: DC

## 2018-04-18 NOTE — Progress Notes (Signed)
Office Visit Note   Patient: Darryl BrooksZachary Harmon           Date of Birth: 25-Feb-1996           MRN: [id number] Visit Date: 04/18/2018 Requested by: Jac Canavanysinger, David S, PA-C [address] [place], [place] [id number] PCP: Jac Canavanysinger, David S, PA-C  Subjective: Chief Complaint  Patient presents with  . Left Knee - Pain, Follow-up    HPI: Darryl CoderZachary is a patient with left knee pain.  Injured his knee about 2 weeks ago.  Has been taking some tramadol and anti-inflammatories without much help.  Went to the emergency room this weekend waited 4 hours but left without being seen.  Has not been able to really return to work in an effective manner.  Localizes pain anterior and medial in the left knee.              ROS: All systems reviewed are negative as they relate to the chief complaint within the history of present illness.  Patient denies  fevers or chills.   Assessment & Plan: Visit Diagnoses:  1. Chronic pain of left knee     Plan: Impression is left knee pain unclear etiology.  No effusion today and collateral cruciates feel stable.  Nonetheless he is having symptoms which is preventing him from undergoing and maintaining his activities of daily living including work.  I would favor MRI scan and if normal injection.  Tramadol prescription today.  He also tried a knee brace which did not help much but it did help some.  Follow-Up Instructions: Return for after MRI.   Orders:  Orders Placed This Encounter  Procedures  . MR Knee Left w/o contrast   Meds ordered this encounter  Medications  . traMADol (ULTRAM) 50 MG tablet    Sig: Take 1 tablet (50 mg total) by mouth 2 (two) times daily.    Dispense:  20 tablet    Refill:  0      Procedures: No procedures performed   Clinical Data: No additional findings.  Objective: Vital Signs: There were no vitals taken for this visit.  Physical Exam:   Constitutional: Patient appears well-developed HEENT:  Head: Normocephalic Eyes:EOM  are normal Neck: Normal range of motion Cardiovascular: Normal rate Pulmonary/chest: Effort normal Neurologic: Patient is alert Skin: Skin is warm Psychiatric: Patient has normal mood and affect    Ortho Exam: Ortho exam demonstrates full active and passive range of motion of that left knee with some medial joint line tenderness as well as some patellar tendon tenderness.  Extensor mechanism is intact.  Pedal pulses palpable.  No masses lymphadenopathy or skin changes noted in that left knee region.  Collateral crucial ligaments feel stable.  No real patellofemoral crepitus or grinding and negative patellar apprehension today.  Specialty Comments:  No specialty comments available.  Imaging: No results found.   PMFS History: Patient Active Problem List   Diagnosis Date Noted  . Encounter for health maintenance examination in adult 09/26/2016  . Screen for STD (sexually transmitted disease) 09/26/2016  . Other fatigue 09/26/2016  . Acne 09/26/2016  . Dermatitis 09/26/2016  . Family history of heart disease 09/26/2016  . Anxiety and depression 09/26/2016   Past Medical History:  Diagnosis Date  . Acne   . Allergic rhinitis   . Anxiety   . Depression   . Wears glasses     Family History  Problem Relation Age of Onset  . Cancer Mother  breast  . Heart disease Mother        related to damage from chemotherapy  . Sleep apnea Mother   . Other Mother        pseudoseizure  . Depression Mother   . Insomnia Father   . Restless legs syndrome Father   . ADD / ADHD Father   . Glaucoma Father   . Depression Brother   . Post-traumatic stress disorder Brother   . Anxiety disorder Brother        MRSA  . Other Brother   . Heart disease Maternal Grandmother        CABG    Past Surgical History:  Procedure Laterality Date  . LACERATION REPAIR     right knee  . NASAL SEPTUM SURGERY     Social History   Occupational History  . Not on file  Tobacco Use  . Smoking  status: Current Every Day Smoker    Types: Cigarettes  . Smokeless tobacco: Never Used  Substance and Sexual Activity  . Alcohol use: Yes    Alcohol/week: 5.0 standard drinks    Types: 4 Glasses of wine, 1 Shots of liquor per week    Comment: 1-2 times a week  . Drug use: No  . Sexual activity: Not on file

## 2018-04-22 ENCOUNTER — Other Ambulatory Visit: Payer: 59

## 2018-04-25 ENCOUNTER — Ambulatory Visit
Admission: RE | Admit: 2018-04-25 | Discharge: 2018-04-25 | Disposition: A | Discharge status: Home / Self Care | Payer: 59 | Source: Ambulatory Visit | Attending: Orthopedic Surgery | Admitting: Orthopedic Surgery

## 2018-04-25 DIAGNOSIS — G8929 Other chronic pain: Secondary | ICD-10-CM

## 2018-04-25 DIAGNOSIS — M25562 Pain in left knee: Principal | ICD-10-CM

## 2018-05-02 ENCOUNTER — Ambulatory Visit (INDEPENDENT_AMBULATORY_CARE_PROVIDER_SITE_OTHER): Payer: 59 | Admitting: Orthopedic Surgery

## 2018-05-02 ENCOUNTER — Encounter (INDEPENDENT_AMBULATORY_CARE_PROVIDER_SITE_OTHER): Payer: Self-pay | Admitting: Orthopedic Surgery

## 2018-05-02 DIAGNOSIS — M25562 Pain in left knee: Secondary | ICD-10-CM

## 2018-05-02 NOTE — Progress Notes (Signed)
Office Visit Note   Patient: Darryl Harmon           Date of Birth: April 11, 1995           MRN: [id number] Visit Date: 05/02/2018 Requested by: Jac Canavan, PA-C [address] [place], [place] [address] PCP: Jac Canavan, PA-C  Subjective: Chief Complaint  Patient presents with  . Left Knee - Pain    HPI: Darryl Harmon is a patient with left knee pain.  Here to review MRI scan.  In general his knee is much better.  MRI scan is reviewed with the patient.  She has a little bit of bone bruising in that posterior lateral femoral condyle.  Menisci and ligaments intact.  Small subchondral impaction fracture without disruption of the overlying articular cartilage is present.  Again he is improving significantly.              ROS: All systems reviewed are negative as they relate to the chief complaint within the history of present illness.  Patient denies  fevers or chills.   Assessment & Plan: Visit Diagnoses:  1. Acute pain of left knee     Plan: Impression is left knee osteochondral impaction bruising posterior lateral femoral condyle.  I think he is improving and not really symptomatic.  He is not doing much in the way of deep knee squats.  I think he is okay for work activity as tolerated.  I did tell him to take it relatively easy on that knee for the next 6 weeks until the bone bruising resolves.  Follow-up as needed  Follow-Up Instructions: Return if symptoms worsen or fail to improve.   Orders:  No orders of the defined types were placed in this encounter.  No orders of the defined types were placed in this encounter.     Procedures: No procedures performed   Clinical Data: No additional findings.  Objective: Vital Signs: There were no vitals taken for this visit.  Physical Exam:   Constitutional: Patient appears well-developed HEENT:  Head: Normocephalic Eyes:EOM are normal Neck: Normal range of motion Cardiovascular: Normal rate Pulmonary/chest: Effort  normal Neurologic: Patient is alert Skin: Skin is warm Psychiatric: Patient has normal mood and affect    Ortho Exam: Ortho exam demonstrates full active and passive range of motion of that left knee with no effusion.  Collateral cruciate ligaments are stable.  Range of motion is full.  Extensor mechanism is nontender and intact.  Specialty Comments:  No specialty comments available.  Imaging: No results found.   PMFS History: Patient Active Problem List   Diagnosis Date Noted  . Encounter for health maintenance examination in adult 09/26/2016  . Screen for STD (sexually transmitted disease) 09/26/2016  . Other fatigue 09/26/2016  . Acne 09/26/2016  . Dermatitis 09/26/2016  . Family history of heart disease 09/26/2016  . Anxiety and depression 09/26/2016   Past Medical History:  Diagnosis Date  . Acne   . Allergic rhinitis   . Anxiety   . Depression   . Wears glasses     Family History  Problem Relation Age of Onset  . Cancer Mother        breast  . Heart disease Mother        related to damage from chemotherapy  . Sleep apnea Mother   . Other Mother        pseudoseizure  . Depression Mother   . Insomnia Father   . Restless legs syndrome Father   .  ADD / ADHD Father   . Glaucoma Father   . Depression Brother   . Post-traumatic stress disorder Brother   . Anxiety disorder Brother        MRSA  . Other Brother   . Heart disease Maternal Grandmother        CABG    Past Surgical History:  Procedure Laterality Date  . LACERATION REPAIR     right knee  . NASAL SEPTUM SURGERY     Social History   Occupational History  . Not on file  Tobacco Use  . Smoking status: Current Every Day Smoker    Types: Cigarettes  . Smokeless tobacco: Never Used  Substance and Sexual Activity  . Alcohol use: Yes    Alcohol/week: 5.0 standard drinks    Types: 4 Glasses of wine, 1 Shots of liquor per week    Comment: 1-2 times a week  . Drug use: No  . Sexual activity:  Not on file

## 2019-05-18 IMAGING — MR MR KNEE*L* W/O CM
6 series · 37 of 40 positions shown · non-contrast
Comparison: Plain films left knee 04/09/2018.

CLINICAL DATA: Medial left knee pain since a slip and fall [DATE]
weeks ago. Subsequent encounter.

EXAM:
MRI OF THE LEFT KNEE WITHOUT CONTRAST
TECHNIQUE: Multiplanar, multisequence MR imaging of the knee was performed. No
intravenous contrast was administered.

[Series 3: T2 fat-sat · axial · left · 4.0mm · 0.50mm/px · z∈[-47,+107]mm · 7 of 36 slices shown (1 of 3)]
[im 1/36]
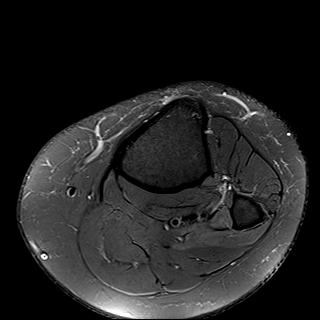
[im 6/36]
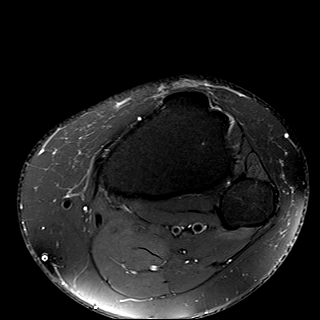
[im 12/36]
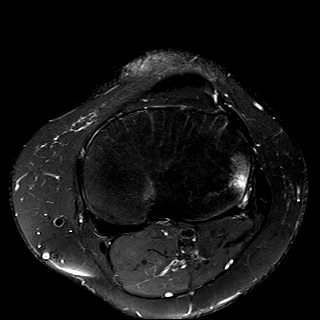
[im 18/36]
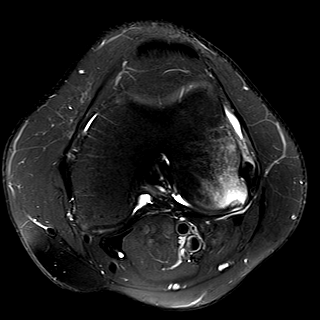
[im 24/36]
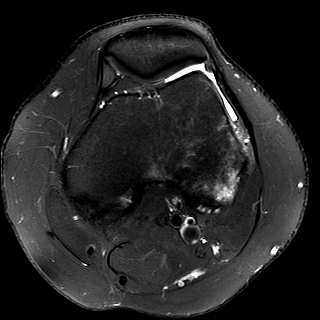
[im 30/36]
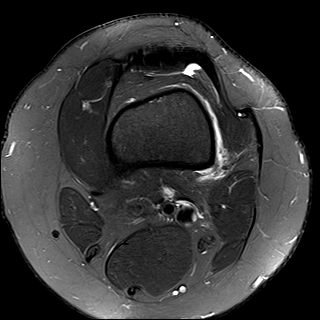
[im 36/36]
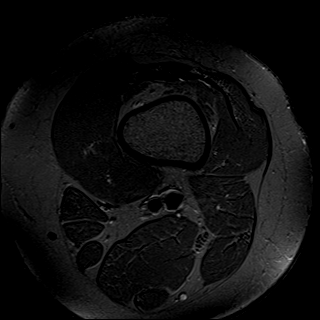

[Series 4: T2 fat-sat · coronal · left · 4.0mm · 0.39mm/px · 6 of 30 slices shown (2 of 3)]
[im 1/30]
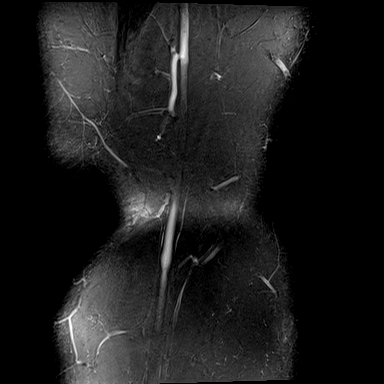
[im 6/30]
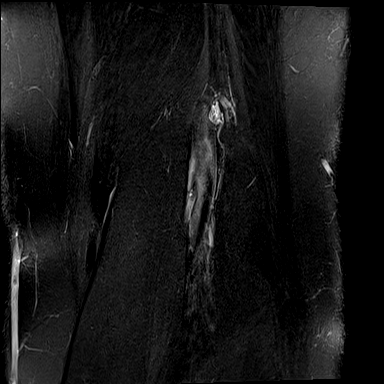
[im 12/30]
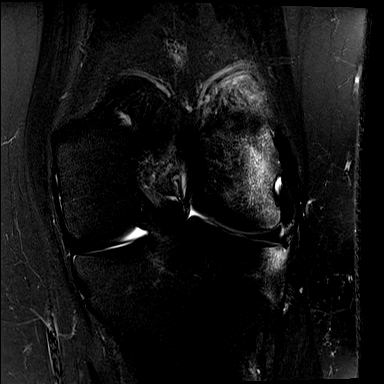
[im 18/30]
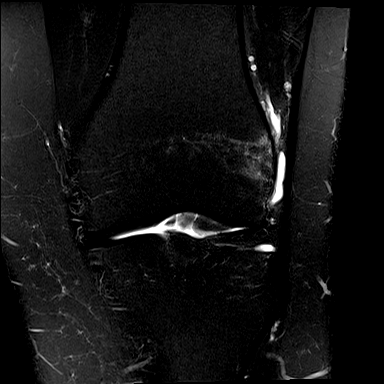
[im 24/30]
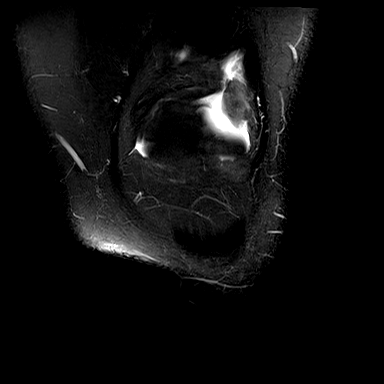
[im 30/30]
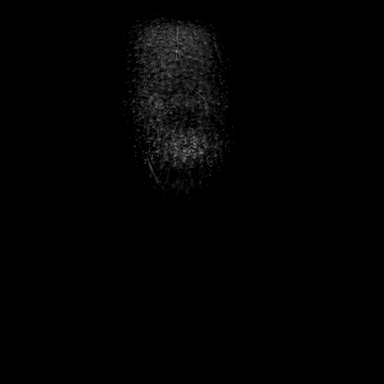

[Series 5: T1 · coronal · left · 4.0mm · 0.39mm/px · 4 of 30 slices shown]
[im 1/30]
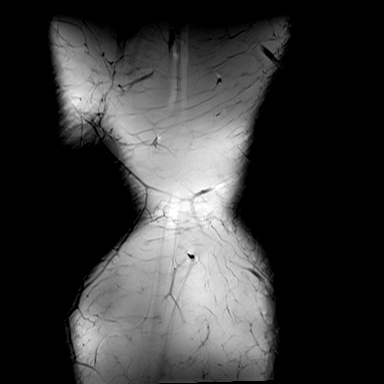
[im 5/30]
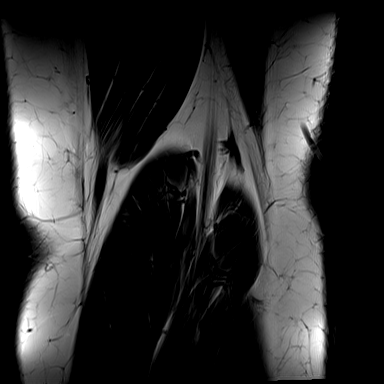
[im 10/30]
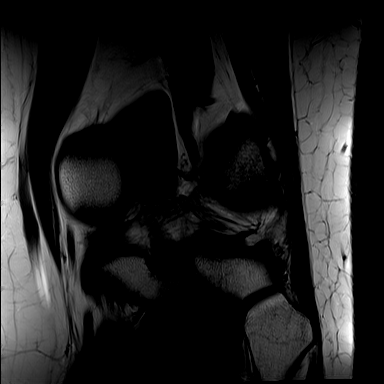
[im 15/30]
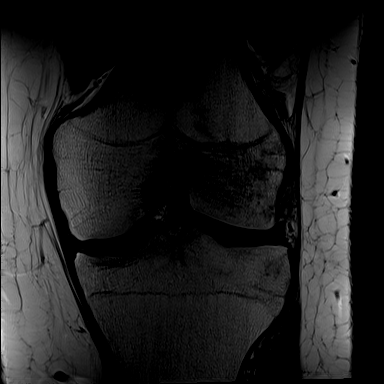

[Series 6: PD fat-sat · coronal · left · 3.0mm · 0.47mm/px · 6 of 28 slices shown (1 of 2)]
[im 1/28]
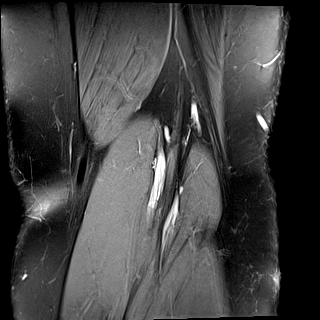
[im 6/28]
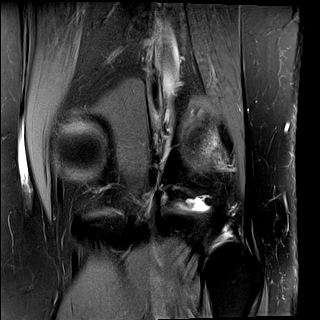
[im 11/28]
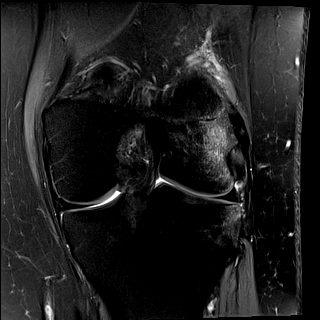
[im 17/28]
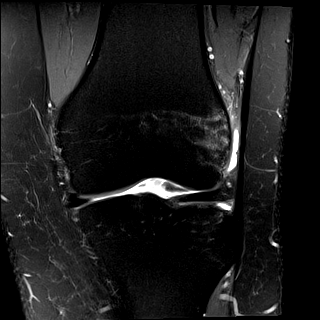
[im 22/28]
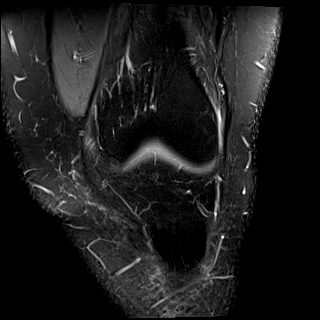
[im 28/28]
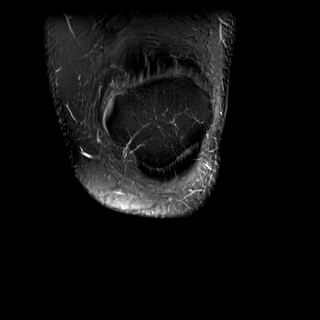

[Series 7: PD fat-sat · sagittal · left · 3.0mm · 0.39mm/px · 7 of 30 slices shown (2 of 2)]
[im 1/30]
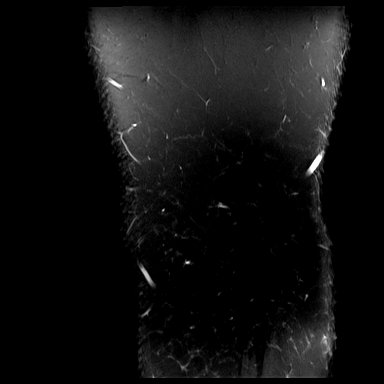
[im 5/30]
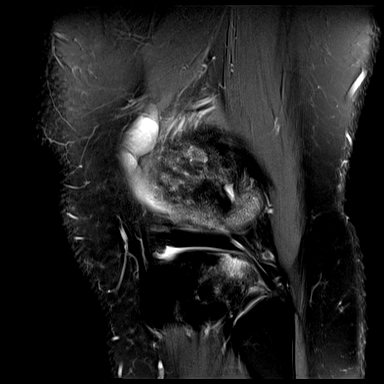
[im 10/30]
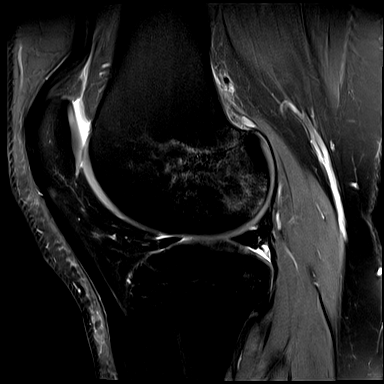
[im 15/30]
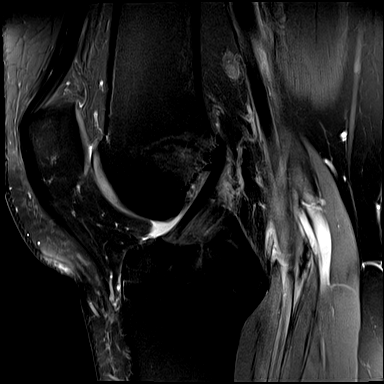
[im 20/30]
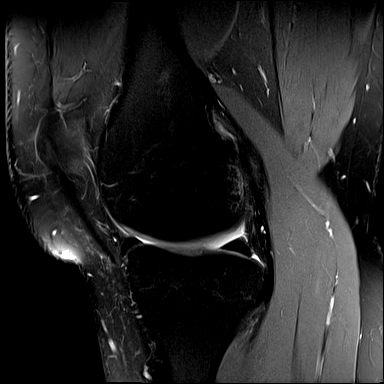
[im 25/30]
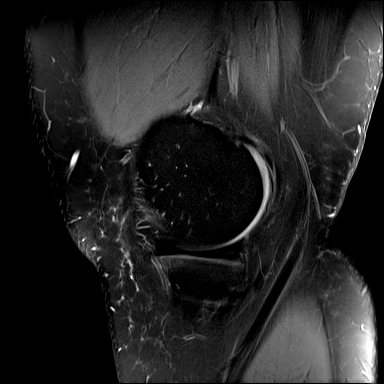
[im 30/30]
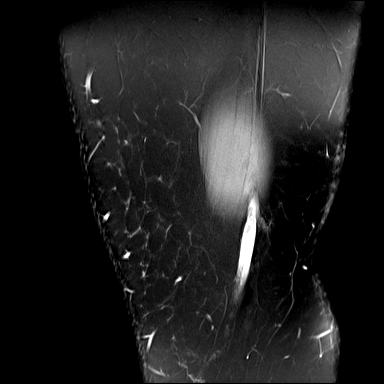

[Series 8: T2 fat-sat · sagittal · left · 3.0mm · 0.39mm/px · 7 of 30 slices shown (3 of 3)]
[im 1/30]
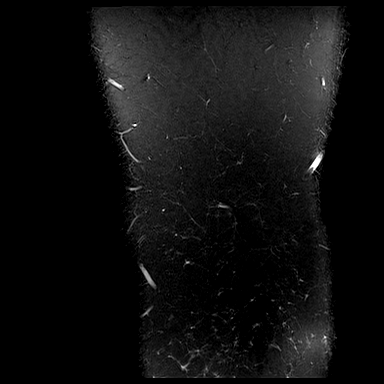
[im 5/30]
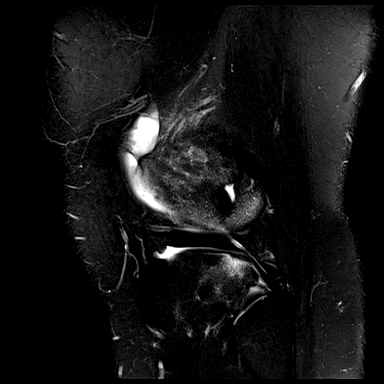
[im 10/30]
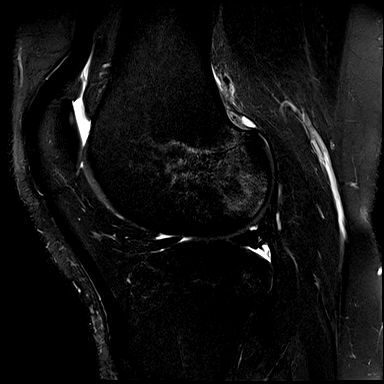
[im 15/30]
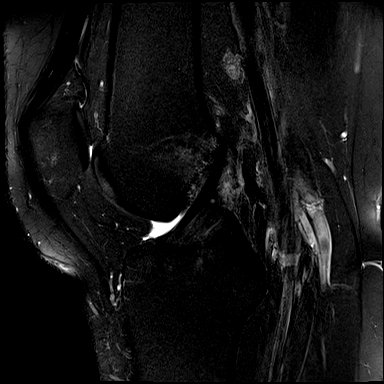
[im 20/30]
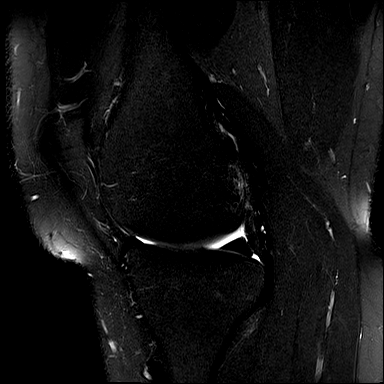
[im 25/30]
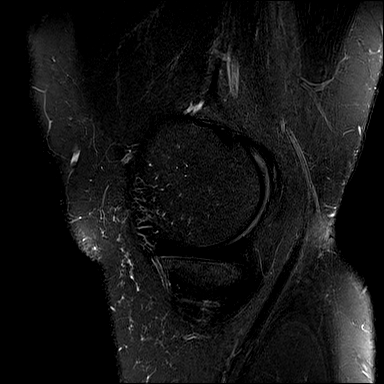
[im 30/30]
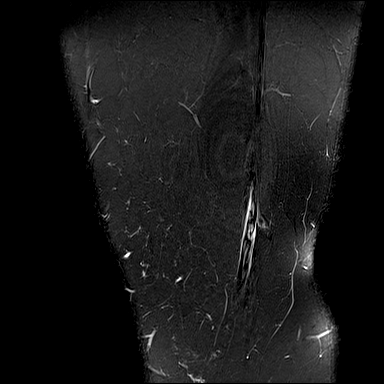

[37 of 40 positions shown; findings below may reference images not displayed]

FINDINGS: MENISCI

Medial meniscus:  Intact.

Lateral meniscus:  Intact.

LIGAMENTS

Cruciates:  Intact.

Collaterals:  Intact.

CARTILAGE

Patellofemoral:  Normal.

Medial:  Normal.

Lateral:  Normal.

Joint:  Trace amount of joint fluid.

Popliteal Fossa:  No Baker's cyst.

Extensor Mechanism:  Intact.

Bones: Fairly extensive marrow edema is seen in the posterior aspect
of the lateral femoral condyle due to a small and nondisplaced
subchondral bone plate fracture. The fracture measures only 0.5 cm
transverse by 0.9 cm craniocaudal. A small bone contusion is also
seen in the posterior corner of the lateral tibial plateau.

Other: None.
IMPRESSION: Small, nondisplaced fracture of the subchondral bone plate of the
posterior, lateral femoral condyle with associated marrow edema.
Small bone contusion in the posterior aspect of the lateral tibial
plateau also noted.

Negative for meniscal or ligament tear.

## 2021-04-13 ENCOUNTER — Ambulatory Visit (INDEPENDENT_AMBULATORY_CARE_PROVIDER_SITE_OTHER): Payer: 59 | Admitting: Family

## 2021-04-13 ENCOUNTER — Encounter: Payer: Self-pay | Admitting: Family

## 2021-04-13 ENCOUNTER — Other Ambulatory Visit (HOSPITAL_COMMUNITY)
Admission: RE | Admit: 2021-04-13 | Discharge: 2021-04-13 | Disposition: A | Discharge status: Home / Self Care | Payer: 59 | Source: Ambulatory Visit | Attending: Family | Admitting: Family

## 2021-04-13 ENCOUNTER — Other Ambulatory Visit: Payer: Self-pay

## 2021-04-13 VITALS — BP 122/80 | HR 96 | Temp 98.0°F | Ht 74.0 in | Wt 231.0 lb

## 2021-04-13 DIAGNOSIS — Z202 Contact with and (suspected) exposure to infections with a predominantly sexual mode of transmission: Secondary | ICD-10-CM | POA: Insufficient documentation

## 2021-04-13 DIAGNOSIS — F900 Attention-deficit hyperactivity disorder, predominantly inattentive type: Secondary | ICD-10-CM

## 2021-04-13 MED ORDER — AMPHETAMINE-DEXTROAMPHET ER 15 MG PO CP24: 15.0000 mg | ORAL_CAPSULE | ORAL | 0 refills | Status: DC

## 2021-04-13 NOTE — Patient Instructions (Addendum)
Welcome to [facility]! It was a pleasure meeting you today.  Go to the lab and provide a urine specimen for testing today. As discussed, I have sent your adderall refill to your pharmacy. Please schedule a 3 month follow up visit today, before you head out to [place]! Call the pharmacy there and ask if ok to receive your Adderall from an NP in [place].    PLEASE NOTE:  If you had any LAB tests please let us know if you have not heard back within a few days. You may see your results on MyChart before we have a chance to review them but we will give you a call once they are reviewed by [place]. If we ordered any REFERRALS today, please let us know if you have not heard from their office within the next week.  Let us know through MyChart if you are needing REFILLS, or have your pharmacy send [place] the request. You can also use MyChart to communicate with me or any office staff.  Please try these tips to maintain a healthy lifestyle:  Eat most of your calories during the day when you are active. Eliminate processed foods including packaged sweets (pies, cakes, cookies), reduce intake of potatoes, white bread, white pasta, and white rice. Look for whole grain options, oat flour or almond flour.  Each meal should contain half fruits/vegetables, one quarter protein, and one quarter carbs (no bigger than a computer mouse).  Cut down on sweet beverages. This includes juice, soda, and sweet tea. Also watch fruit intake, though this is a healthier sweet option, it still contains natural sugar! Limit to 3 servings daily.  Drink at least 1 glass of water with each meal and aim for at least 8 glasses per day  Exercise at least 150 minutes every week.

## 2021-04-13 NOTE — Assessment & Plan Note (Addendum)
had a previous PCP in town that left practice. 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 PDMP checked and verified, controlled substance contract signed.

## 2021-04-13 NOTE — Progress Notes (Signed)
New Patient Office Visit  Subjective:  Patient ID: Darryl Harmon, male    DOB: 21-Aug-1995  Age: 26 y.o. MRN: [id number]  CC:  Chief Complaint  Patient presents with   Establish Care   ADHD    HPI Darryl Harmon presents for establishing care and to discuss one chronic problem.  ADHD f/u: Medications helping target goals: Adderall ER Regimen: daily Medication side effects/concerns: none Weight: no change Sleep: no concerns Mood changes: none  Tics: none Blood pressure, Weight, Pulse, Behavior reviewed: no changes     Past Medical History:  Diagnosis Date   Acne    Allergic rhinitis    Wears glasses     Past Surgical History:  Procedure Laterality Date   LACERATION REPAIR     right knee   NASAL SEPTUM SURGERY      Family History  Problem Relation Age of Onset   Cancer Mother        breast   Heart disease Mother        related to damage from chemotherapy   Sleep apnea Mother    Other Mother        pseudoseizure   Depression Mother    Insomnia Father    Restless legs syndrome Father    ADD / ADHD Father    Glaucoma Father    Depression Brother    Post-traumatic stress disorder Brother    Anxiety disorder Brother        MRSA   Other Brother    Heart disease Maternal Grandmother        CABG    Social History   Socioeconomic History   Marital status: Single    Spouse name: Not on file   Number of children: Not on file   Years of education: Not on file   Highest education level: Not on file  Occupational History   Not on file  Tobacco Use   Smoking status: Every Day    Types: Cigarettes   Smokeless tobacco: Never  Vaping Use   Vaping Use: Never used  Substance and Sexual Activity   Alcohol use: Yes    Alcohol/week: 5.0 standard drinks    Types: 4 Glasses of wine, 1 Shots of liquor per week    Comment: 1-2 times a week   Drug use: No   Sexual activity: Not on file  Other Topics Concern   Not on file  Social History Narrative   Not on  file   Social Determinants of Health   Financial Resource Strain: Not on file  Food Insecurity: Not on file  Transportation Needs: Not on file  Physical Activity: Not on file  Stress: Not on file  Social Connections: Not on file  Intimate Partner Violence: Not on file    Objective:   Today's Vitals: BP 122/80    Pulse 96    Temp 98 F (36.7 C) (Temporal)    Ht 6\' 2"  (1.88 m)    Wt 231 lb (104.8 kg)    SpO2 100%    BMI 29.66 kg/m   Physical Exam Vitals and nursing note reviewed.  Constitutional:      General: He is not in acute distress.    Appearance: Normal appearance.  HENT:     Head: Normocephalic.  Cardiovascular:     Rate and Rhythm: Normal rate and regular rhythm.  Pulmonary:     Effort: Pulmonary effort is normal.     Breath sounds: Normal breath sounds.  Musculoskeletal:  General: Normal range of motion.     Cervical back: Normal range of motion.  Skin:    General: Skin is warm and dry.  Neurological:     Mental Status: He is alert and oriented to person, place, and time.  Psychiatric:        Mood and Affect: Mood normal.    Assessment & Plan:   Problem List Items Addressed This Visit       Other   ADHD (attention deficit hyperactivity disorder), inattentive type - Primary    had a previous PCP in town that left practice. 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 PDMP checked and verified, controlled substance contract signed.      Relevant Medications   amphetamine-dextroamphetamine (ADDERALL XR) 15 MG 24 hr capsule   amphetamine-dextroamphetamine (ADDERALL XR) 15 MG 24 hr capsule (Start on 05/13/2021)   amphetamine-dextroamphetamine (ADDERALL XR) 15 MG 24 hr capsule (Start on 06/12/2021)   Other Visit Diagnoses     Possible exposure to STD       Relevant Orders   Urine  cytology ancillary only       Outpatient Encounter Medications as of 04/13/2021  Medication Sig   amphetamine-dextroamphetamine (ADDERALL XR) 15 MG 24 hr capsule Take 1 capsule by mouth every morning.   [START ON 05/13/2021] amphetamine-dextroamphetamine (ADDERALL XR) 15 MG 24 hr capsule Take 1 capsule by mouth every morning.   [START ON 06/12/2021] amphetamine-dextroamphetamine (ADDERALL XR) 15 MG 24 hr capsule Take 1 capsule by mouth every morning.   ARIPiprazole (ABILIFY) 2 MG tablet TAKE 2 MG BY MOUTH DAILY   sertraline (ZOLOFT) 100 MG tablet TAKE 100 MG BY MOUTH DAILY   traZODone (DESYREL) 100 MG tablet Take 100 mg by mouth at bedtime.   [DISCONTINUED] amphetamine-dextroamphetamine (ADDERALL XR) 15 MG 24 hr capsule Take by mouth every morning.   [DISCONTINUED] ibuprofen (ADVIL,MOTRIN) 800 MG tablet Take 1 tablet (800 mg total) by mouth every 8 (eight) hours as needed. (Patient not taking: Reported on 04/13/2021)   [DISCONTINUED] omeprazole (PRILOSEC) 40 MG capsule TAKE 1 CAPSULE(40 MG) BY MOUTH DAILY (Patient not taking: Reported on 04/13/2021)   [DISCONTINUED] traMADol (ULTRAM) 50 MG tablet Take 1 tablet (50 mg total) by mouth every 6 (six) hours as needed for severe pain. (Patient not taking: Reported on 04/13/2021)   [DISCONTINUED] traMADol (ULTRAM) 50 MG tablet Take 1 tablet (50 mg total) by mouth 2 (two) times daily. (Patient not taking: Reported on 04/13/2021)   No facility-administered encounter medications on file as of 04/13/2021.    Follow-up: Return in about 3 months (around 07/12/2021) for adhd refill.   Dulce Sellar, NP

## 2021-04-14 LAB — URINE CYTOLOGY ANCILLARY ONLY
Bacterial Vaginitis-Urine: NEGATIVE
Chlamydia: NEGATIVE
Comment: NEGATIVE
Comment: NEGATIVE
Comment: NORMAL
Neisseria Gonorrhea: NEGATIVE
Trichomonas: NEGATIVE

## 2021-04-22 ENCOUNTER — Encounter: Payer: Self-pay | Admitting: Family

## 2021-05-03 ENCOUNTER — Ambulatory Visit: Payer: 59 | Admitting: Family

## 2021-05-17 ENCOUNTER — Other Ambulatory Visit: Payer: Self-pay

## 2021-05-17 ENCOUNTER — Ambulatory Visit (INDEPENDENT_AMBULATORY_CARE_PROVIDER_SITE_OTHER): Payer: 59 | Admitting: Family

## 2021-05-17 ENCOUNTER — Encounter: Payer: Self-pay | Admitting: Family

## 2021-05-17 VITALS — BP 130/80 | HR 84 | Temp 98.2°F | Ht 74.0 in | Wt 231.6 lb

## 2021-05-17 DIAGNOSIS — F900 Attention-deficit hyperactivity disorder, predominantly inattentive type: Secondary | ICD-10-CM

## 2021-05-17 DIAGNOSIS — B369 Superficial mycosis, unspecified: Secondary | ICD-10-CM | POA: Diagnosis not present

## 2021-05-17 MED ORDER — LISDEXAMFETAMINE DIMESYLATE 40 MG PO CAPS: 40.0000 mg | ORAL_CAPSULE | ORAL | 0 refills | Status: DC

## 2021-05-17 MED ORDER — METHYLPHENIDATE HCL ER (OSM) 27 MG PO TBCR: 27.0000 mg | EXTENDED_RELEASE_TABLET | ORAL | 0 refills | Status: DC

## 2021-05-17 MED ORDER — CLOTRIMAZOLE 1 % EX CREA: 1.0000 "application " | TOPICAL_CREAM | Freq: Two times a day (BID) | CUTANEOUS | 1 refills | Status: AC

## 2021-05-17 MED ORDER — CLOTRIMAZOLE 1 % EX CREA: 1.0000 "application " | TOPICAL_CREAM | Freq: Two times a day (BID) | CUTANEOUS | 1 refills | Status: DC

## 2021-05-17 NOTE — Addendum Note (Signed)
Addended byDulce Sellar on: 05/17/2021 11:03 AM   Modules accepted: Orders

## 2021-05-17 NOTE — Assessment & Plan Note (Signed)
pt requesting to switch to Vyvanse due to Adderall shortage. States he also would like a higher dose, had been on 15mg  of Adderall, will start Vyvanse 40mg  and advised pt to let me know if this is too low for next month.

## 2021-05-17 NOTE — Progress Notes (Addendum)
Subjective:     Patient ID: Darryl Harmon, male    DOB: 02/11/1996, 26 y.o.   MRN: [id number]  Chief Complaint  Patient presents with   ADHD    Pt would like to switch to Vyvanse due to the Adderall shortage.     HPI: Skin Rash: Patient complains of skin rash. The rash is located on the  left abdomen . The rash has been present a few weeks. Pt denies pain, drainage, or itching, states it started out as one lesion, but has spread into more lesions.  Interventions to date: none. Patient denies tobacco use. Patient does not have a history of diabetes.  ADHD f/u: Medications helping target goals: Adderall ER Regimen: daily Medication side effects/concerns: none, but is having a hard time finding med in stock, would like to switch to Vyvanse and see if any easier to find. Weight: no change Sleep: no concerns Mood changes: none  Tics: none Blood pressure, Weight, Pulse, Behavior reviewed: no changes   There are no preventive care reminders to display for this patient.  Past Medical History:  Diagnosis Date   Acne    Allergic rhinitis    Wears glasses     Past Surgical History:  Procedure Laterality Date   LACERATION REPAIR     right knee   NASAL SEPTUM SURGERY      Outpatient Medications Prior to Visit  Medication Sig Dispense Refill   ARIPiprazole (ABILIFY) 15 MG tablet      sertraline (ZOLOFT) 100 MG tablet TAKE 100 MG BY MOUTH DAILY  1   traZODone (DESYREL) 100 MG tablet Take 100 mg by mouth at bedtime.     amphetamine-dextroamphetamine (ADDERALL XR) 15 MG 24 hr capsule Take 1 capsule by mouth every morning. 30 capsule 0   ARIPiprazole (ABILIFY) 2 MG tablet TAKE 2 MG BY MOUTH DAILY  1   amphetamine-dextroamphetamine (ADDERALL XR) 15 MG 24 hr capsule Take 1 capsule by mouth every morning. 30 capsule 0   [START ON 06/12/2021] amphetamine-dextroamphetamine (ADDERALL XR) 15 MG 24 hr capsule Take 1 capsule by mouth every morning. (Patient not taking: Reported on 05/17/2021) 30  capsule 0   No facility-administered medications prior to visit.    No Known Allergies      Objective:    Physical Exam Vitals and nursing note reviewed.  Constitutional:      General: He is not in acute distress.    Appearance: Normal appearance.  HENT:     Head: Normocephalic.  Cardiovascular:     Rate and Rhythm: Normal rate and regular rhythm.  Pulmonary:     Effort: Pulmonary effort is normal.     Breath sounds: Normal breath sounds.  Musculoskeletal:        General: Normal range of motion.     Cervical back: Normal range of motion.  Skin:    General: Skin is warm and dry.     Findings: Rash (left abdomen) present.          Comments: dark reddish-brown circular flat lesions of different sizes, diffuse, in approx. 8inch region on left abdomen  Neurological:     Mental Status: He is alert and oriented to person, place, and time.  Psychiatric:        Mood and Affect: Mood normal.    BP 130/80    Pulse 84    Temp 98.2 F (36.8 C) (Temporal)    Ht 6\' 2"  (1.88 m)    Wt 231 lb 9.6 oz (  105.1 kg)    SpO2 98%    BMI 29.74 kg/m  Wt Readings from Last 3 Encounters:  05/17/21 231 lb 9.6 oz (105.1 kg)  04/13/21 231 lb (104.8 kg)  04/17/18 230 lb (104.3 kg)       Assessment & Plan:   Problem List Items Addressed This Visit       Other   ADHD (attention deficit hyperactivity disorder), inattentive type - Primary    pt requesting to switch to Vyvanse due to Adderall shortage. States he also would like a higher dose, had been on 15mg  of Adderall, will start Vyvanse 40mg  and advised pt to let me know if this is too low for next month.      Other Visit Diagnoses     Fungal dermatitis       Relevant Medications   clotrimazole (LOTRIMIN) 1 % cream       Meds ordered this encounter  Medications   DISCONTD: lisdexamfetamine (VYVANSE) 40 MG capsule    Sig: Take 1 capsule (40 mg total) by mouth every morning.    Dispense:  30 capsule    Refill:  0    Order  Specific Question:   Supervising Provider    Answer:   ANDY, CAMILLE L [[id number]]   clotrimazole (LOTRIMIN) 1 % cream    Sig: Apply 1 application topically 2 (two) times daily.    Dispense:  30 g    Refill:  1    Order Specific Question:   Supervising Provider    Answer:   ANDY, CAMILLE L [[id number]]

## 2021-05-17 NOTE — Addendum Note (Signed)
Addended byDulce Sellar on: 05/17/2021 04:59 PM   Modules accepted: Orders

## 2021-05-17 NOTE — Patient Instructions (Signed)
It was very nice to see you today!  I have sent in Vyvanse to your pharmacy, if this is not covered, or too expensive, send me a message thru [place].  I can then try to send Concerta ER as another option.    PLEASE NOTE:  If you had any lab tests please let us know if you have not heard back within a few days. You may see your results on MyChart before we have a chance to review them but we will give you a call once they are reviewed by [place]. If we ordered any referrals today, please let us know if you have not heard from their office within the next week.

## 2021-06-26 ENCOUNTER — Encounter: Payer: Self-pay | Admitting: Family

## 2021-06-26 ENCOUNTER — Other Ambulatory Visit: Payer: Self-pay | Admitting: Family

## 2021-06-26 DIAGNOSIS — F900 Attention-deficit hyperactivity disorder, predominantly inattentive type: Secondary | ICD-10-CM

## 2021-06-28 MED ORDER — METHYLPHENIDATE HCL ER (OSM) 27 MG PO TBCR: 27.0000 mg | EXTENDED_RELEASE_TABLET | ORAL | 0 refills | Status: DC

## 2021-06-28 NOTE — Telephone Encounter (Signed)
Last refill: 05/17/21 #30, 0 ?Last OV: 05/17/21 dx. ADHD ?

## 2021-07-01 ENCOUNTER — Ambulatory Visit: Payer: 59 | Admitting: Psychology

## 2021-07-01 DIAGNOSIS — F89 Unspecified disorder of psychological development: Secondary | ICD-10-CM

## 2021-07-01 NOTE — Progress Notes (Signed)
Mr. Shell reported he was seeking an autism spectrum disorder evaluation. This Clinical research associate explained his referral indicated he was seeking an ADHD evaluation. This Clinical research associate also explained he does not complete autism spectrum disorder evaluations and plans to notify front desk he is seeking an autism spectrum disorder evaluation so he could be scheduled with an appropriate provider. He noted understanding this plan and denied having any other issues or concerns.  ? ?No billing code for the appointment.  ? ? ? ? ? ? ? ? ? ? ? ? ? ? ?Margarite Gouge, PsyD ?

## 2021-07-02 ENCOUNTER — Encounter: Payer: Self-pay | Admitting: Family

## 2021-07-02 ENCOUNTER — Ambulatory Visit (INDEPENDENT_AMBULATORY_CARE_PROVIDER_SITE_OTHER): Payer: 59 | Admitting: Family

## 2021-07-02 DIAGNOSIS — F900 Attention-deficit hyperactivity disorder, predominantly inattentive type: Secondary | ICD-10-CM | POA: Diagnosis not present

## 2021-07-02 MED ORDER — METHYLPHENIDATE HCL ER (OSM) 27 MG PO TBCR: 27.0000 mg | EXTENDED_RELEASE_TABLET | ORAL | 0 refills | Status: AC

## 2021-07-02 NOTE — Progress Notes (Signed)
? ?Subjective:  ? ? ? Patient ID: Darryl Harmon, adult    DOB: [birth year], 26 y.o.   MRN: [id number] ? ?Chief Complaint  ?Patient presents with  ? ADHD  ? ?HPI: ?ADHD f/u: ?Medications helping target goals: Concerta 27mg  ?Regimen:daily ?Medication side effects/concerns: not working as well as the Adderall, he just picked up his RX, so unable to increase dose now and he is moving to next week. ?Weight: no change ?Sleep:no issues ?Mood changes: none ?Tics: none ?Blood pressure, Weight, Pulse, Behavior reviewed: wnl ?  ?  ?Health Maintenance Due  ?Topic Date Due  ? PAP SMEAR-Modifier  Never done  ? ? ?Past Medical History:  ?Diagnosis Date  ? Acne   ? Allergic rhinitis   ? Wears glasses   ? ? ?Past Surgical History:  ?Procedure Laterality Date  ? LACERATION REPAIR    ? right knee  ? NASAL SEPTUM SURGERY    ? ? ?Outpatient Medications Prior to Visit  ?Medication Sig Dispense Refill  ? ARIPiprazole (ABILIFY) 15 MG tablet     ? clotrimazole (LOTRIMIN) 1 % cream Apply 1 application topically 2 (two) times daily. 30 g 1  ? sertraline (ZOLOFT) 100 MG tablet TAKE 100 MG BY MOUTH DAILY  1  ? traZODone (DESYREL) 100 MG tablet Take 100 mg by mouth at bedtime.    ? methylphenidate (CONCERTA) 27 MG PO CR tablet Take 1 tablet (27 mg total) by mouth every morning. 30 tablet 0  ? ?No facility-administered medications prior to visit.  ? ? ?No Known Allergies ? ? ?   ?Objective:  ?  ?Physical Exam ?Vitals and nursing note reviewed.  ?Constitutional:   ?   Appearance: Normal appearance.  ?Cardiovascular:  ?   Rate and Rhythm: Normal rate and regular rhythm.  ?Pulmonary:  ?   Effort: Pulmonary effort is normal.  ?   Breath sounds: Normal breath sounds.  ?Musculoskeletal:     ?   General: Normal range of motion.  ?Skin: ?   General: Skin is warm and dry.  ?Neurological:  ?   Mental Status: Darryl Harmon is alert.  ?Psychiatric:     ?   Mood and Affect: Mood normal.     ?   Behavior: Behavior normal.  ? ? ?BP 116/75 (BP Location:  Left Arm, Patient Position: Sitting, Cuff Size: Large)   Pulse 83   Temp 98.7 ?F (37.1 ?C) (Temporal)   Ht 6\' 2"  (1.88 m)   Wt 242 lb 4 oz (109.9 kg)   SpO2 97%   BMI 31.10 kg/m?  ?Wt Readings from Last 3 Encounters:  ?07/02/21 242 lb 4 oz (109.9 kg)  ?05/17/21 231 lb 9.6 oz (105.1 kg)  ?04/13/21 231 lb (104.8 kg)  ? ? ?   ?Assessment & Plan:  ? ?Problem List Items Addressed This Visit   ? ?  ? Other  ? ADHD (attention deficit hyperactivity disorder), inattentive type  ?  pt reports the Concerta is not working as well as the Adderall did, he just picked up his RX, so unable to increase dose now and he is moving to [place] next week, so he will f/u there and see if I can send one more month & increase dose at that time, he has an appointment with a provider, but not for a few months. ?  ?  ? Relevant Medications  ? methylphenidate (CONCERTA) 27 MG PO CR tablet  ? ? ?Meds ordered this encounter  ?Medications  ? methylphenidate (  CONCERTA) 27 MG PO CR tablet  ?  Sig: Take 1 tablet (27 mg total) by mouth every morning.  ?  Dispense:  30 tablet  ?  Refill:  0  ?  Order Specific Question:   Supervising Provider  ?  Answer:   Darryl Harmon [[id number]]  ? ? ?Dulce Sellar, NP ? ?

## 2021-07-02 NOTE — Assessment & Plan Note (Signed)
pt reports the Concerta is not working as well as the Adderall did, he just picked up his RX, so unable to increase dose now and he is moving to [place] next week, so he will f/u there and see if I can send one more month & increase dose at that time, he has an appointment with a provider, but not for a few months. ?

## 2021-07-05 ENCOUNTER — Ambulatory Visit: Payer: 59 | Admitting: Family

## 2021-07-07 ENCOUNTER — Ambulatory Visit: Payer: 59 | Admitting: Psychology
